# Patient Record
Sex: Female | Born: 1985 | Race: White | Hispanic: Yes | Marital: Single | Smoking: Current every day smoker
Health system: Southern US, Community
[De-identification: ages and names within clinical notes are randomized; demographics above are authoritative.]

## PROBLEM LIST (undated history)

## (undated) DIAGNOSIS — K219 Gastro-esophageal reflux disease without esophagitis: Secondary | ICD-10-CM

## (undated) DIAGNOSIS — F419 Anxiety disorder, unspecified: Secondary | ICD-10-CM

## (undated) DIAGNOSIS — F32A Depression, unspecified: Secondary | ICD-10-CM

## (undated) HISTORY — DX: Gastro-esophageal reflux disease without esophagitis: K21.9

## (undated) HISTORY — DX: Anxiety disorder, unspecified: F41.9

## (undated) HISTORY — DX: Depression, unspecified: F32.A

## (undated) HISTORY — PX: TUBAL LIGATION: SHX77

---
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
## 2007-06-29 ENCOUNTER — Other Ambulatory Visit: Admission: RE | Admit: 2007-06-29 | Discharge: 2007-06-29 | Payer: Self-pay | Admitting: Obstetrics and Gynecology

## 2007-07-29 ENCOUNTER — Inpatient Hospital Stay (HOSPITAL_COMMUNITY): Admission: AD | Admit: 2007-07-29 | Discharge: 2007-07-29 | Payer: Self-pay | Admitting: Obstetrics and Gynecology

## 2007-08-14 ENCOUNTER — Inpatient Hospital Stay (HOSPITAL_COMMUNITY): Admission: AD | Admit: 2007-08-14 | Discharge: 2007-08-16 | Payer: Self-pay | Admitting: Obstetrics and Gynecology

## 2007-12-30 ENCOUNTER — Emergency Department (HOSPITAL_COMMUNITY): Admission: EM | Admit: 2007-12-30 | Discharge: 2007-12-30 | Payer: Self-pay | Admitting: Emergency Medicine

## 2009-03-29 ENCOUNTER — Emergency Department (HOSPITAL_COMMUNITY): Admission: EM | Admit: 2009-03-29 | Discharge: 2009-03-29 | Payer: Self-pay | Admitting: Emergency Medicine

## 2010-09-28 NOTE — H&P (Signed)
Connie [name], Connie [name]                ACCOUNT NO.:  [id number]   MEDICAL RECORD NO.:  [id number]          PATIENT TYPE:  OUT   LOCATION:  ULT                           [facility]   PHYSICIAN:  Charles A. Delcambre, MDDATE OF BIRTH:  1985-05-18   DATE OF ADMISSION:  05/14/2006  DATE OF DISCHARGE:                              HISTORY & PHYSICAL   This patient to be admitted on May 17, 2006 to undergo induction of  labor secondary to intrauterine growth restriction, with ultrasound done  on May 14, 2006 showing estimated fetal weight 3 to 10th percentile,  AFI 9.5 cm.  Repeat AFI at the hospital with a biophysical was 13 cm, to  my recollection.  Estimated fetal weight at the office here was 2658 gm,  5 pounds, 14 ounces.  She had a 10/10 biophysical profile and negative  Dopplers on May 14, 2006 and is to have an NST again tomorrow on  January 4, and if this is normal, she will present for induction on  scheduled date as noted above.  She has noted active fetal movement at  all times.  Pregnancy is complicated by Chlamydia with test positive  December 09, 2005.  Tests have appeared negative, and repeat testing at 36  weeks has been negative.  Other obstetrical labs are blood type B+,  antibody screen negative, VDRL non-reactive, Rubella immune, hepatitis B  surface antigen negative, HIV non-reactive, TSH normal, Pap negative, GC  negative, Chlamydia positive, test of cure negative.  GC/Chlamydia  repeat at 36 weeks negative, RPR at 28 weeks negative, group B strep  negative, 1-hour glucose 71, hemoglobin at 28 weeks 11.7.   PAST MEDICAL HISTORY:  None.   SURGICAL HISTORY:  SVD x1.   MEDICATIONS:  Prenatal vitamins and iron.   ALLERGIES:  PENICILLIN and AMOXICILLIN; reaction not specified.   SOCIAL HISTORY:  Denies tobacco, ethanol or drug use.  She is not  married.  She states she has been in a monogamous relationship with her  fiance, however, STDs as noted above.   FAMILY HISTORY:  Positive for diabetes, hypertension, heart disease,  colon cancer and uterine cancer.  Denied breast, ovary, uterus, cervical  cancer, lymphoma and stroke.   REVIEW OF SYSTEMS:  No chills, fever, rashes or lesions, headaches,  dizziness, scotomata, right upper quadrant pain, shortness of breath,  wheezing, diarrhea, constipation, urgency, frequency, dysuria,  galactorrhea or emotional changes.   PHYSICAL EXAM:  GENERAL:  Alert and oriented x3.  No distress.  VITAL SIGNS:  Blood pressure 100/70, respirations 18, pulse 90, weight  149 pounds, afebrile.  HEENT EXAM:  Grossly within normal limits.  NECK:  Supple without thyromegaly or adenopathy.  LUNGS:  Clear bilaterally.  HEART:  Regular rate and rhythm, 2/6 systolic ejection murmur at left  sternal border.  BREASTS:  No masses, tenderness or discharge, skin or nipple changes  bilaterally.  ABDOMEN:  Gravid.  Fundal height 36 cm at 38 weeks.  PELVIC EXAM:  Normal external female genitalia.  Bartholin's, urethral,  Skene's within normal limits.  Vulva without discharge or lesions.  Cervix  2 cm dilated, 50% effaced, -1 station, vertex and intact.  EXTREMITIES:  Reflexes 1-2+, symmetrical, minimal edema bilaterally.   ASSESSMENT:  Intrauterine pregnancy, planning to be 38 weeks in 4 days,  with intrauterine growth restriction, new onset, history of  oligohydramnios at previous pregnancy at term.   PLAN:  Low-dose Pitocin induction Saturday morning May 17, 2006 at  0830.  Fetal movement precautions have been given through that time, and  NST will be done tomorrow, as well.      Charles A. Sydnee Cabal, MD  Electronically Signed     CAD/MEDQ  D:  05/15/2006  T:  05/15/2006  Job:  [id number]
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
## 2010-09-28 NOTE — H&P (Signed)
NAMEHADIYA, [name]                ACCOUNT NO.:  [id number]   MEDICAL RECORD NO.:  [id number]          PATIENT TYPE:  MAT   LOCATION:  MATC                          [facility]:  [facility]   PHYSICIAN:  James A. Ashley Royalty, M.D.DATE OF BIRTH:  08/21/1985   DATE OF ADMISSION:  05/25/2004  DATE OF DISCHARGE:                                HISTORY & PHYSICAL   This is a 25 year old primigravida, EDC May 26, 2004, 39 weeks, 6 days  gestation.  Prenatal care was complicated by anemia.  The patient was sent  to [facility] this afternoon for an ultrasound in order to assess  fetal growth.  Please see separate ultrasound report. The ultrasound showed  the baby to be AGA.  However, the amniotic fluid index was approximately 7.3  which was slightly greater than the fifth percentile.  The patient was  admitted for the borderline low amniotic fluid index along with advanced  cervical changes at term.   MEDICATIONS:  Vitamins.   PAST MEDICAL HISTORY:  Medical:  Negative.  Surgical:  Negative.   ALLERGIES:  AMOXICILLIN--rash.   FAMILY HISTORY:  Noncontributory.   SOCIAL HISTORY:  The patient denies the use of tobacco or significant  alcohol.   REVIEW OF SYMPTOMS:  Noncontributory.   PHYSICAL EXAMINATION:  GENERAL:  Well-developed, well-nourished, pleasant  white female in no acute distress.  VITAL SIGNS:  Afebrile, vital signs stable.  SKIN:  Warm and dry without lesions.  LYMPH:  There is no supraclavicular, cervical or inguinal adenopathy.  HEENT:  Normocephalic.  CHEST:  Lungs are clear.  CARDIAC:  Regular rate and rhythm.  ABDOMEN:  Gravid with a term fundal height. Fetal heart tones are  auscultated with a Doppler.  MUSCULOSKELETAL:  No CVA tenderness.  PELVIC:  By me (4:20 p.m.), cervix was 2-3 cm dilated, 80%, -1 to zero  station, vertex presentation.   IMPRESSION:  1.  Intrauterine pregnancy at 39 weeks 6 days gestation.  2.  Advanced cervical changes.  3.  Borderline line  low amniotic fluid index on ultrasound performed today.   PLAN:  1.  Admit for overnight observation.  2.  Induction in a.m.  The risks, benefits and complications were discussed      with the patient. She states she understands and accepts. Questions      invited and answered.      JAM/MEDQ  D:  05/25/2004  T:  05/25/2004  Job:  [id number]

## 2010-09-28 NOTE — Op Note (Signed)
[name], Connie                ACCOUNT NO.:  [id number]   MEDICAL RECORD NO.:  [id number]          PATIENT TYPE:  INP   LOCATION:  9175                          [facility]ILITY[facility]  [facility]   PHYSICIAN:  Charles A. Delcambre, MDDATE OF BIRTH:  [birth year]   DATE OF PROCEDURE:  05/17/2006  DATE OF DISCHARGE:                               OPERATIVE REPORT   DELIVERY NOTE:  This patient was admitted to undergo induction of labor secondary to  intrauterine growth restriction at 38 weeks 5 days, estimated fetal  weight 2658 g, normal AFI, negative Dopplers.  She presented with cervix  approximately 3 cm, 75% effaced, -1 station upon arrival and Pitocin had  been given for approximately 30 minutes.  Artificial rupture of  membranes was done, clear amniotic fluid was noted.  There was no  complication.  She progressed on to 4 cm and was given epidural at her  request.  She progressed on to 6 cm, 90% effaced and 0 station and had  some mild variables at that time.  These were progressing to become  moderate.  For that reason amnioinfusion was placed with an intrauterine  pressure catheter.  She precipitously progressed over the next 10-15  minutes, had fetal bradycardia to the 60s for several minutes, returning  to the 80s, and was noted to be complete, +2.  She had an epidural and  had some difficulty pushing at that last point and with the fetal heart  rate remaining at less than 100, she gave informed consent for vacuum  extraction.  The Kiwi vacuum was placed with position right occiput  anterior, complete, complete, +2, over the occiput green zone of the  Kiwi vacuum.  There were no pop-offs and she had good progression with 1  contraction, 1 pull, and delivered without difficulty.  Dr. Alison Murray  attended delivery.  The cord was found to be around the neck very  tightly, had to be delivered through.  Furthermore, it was around the  body and around the leg tightly.  The placenta was spontaneous, 3-  vessel, and intact.  The baby was a vigorous female, Apgars 9 and 9.  A  second degree midline laceration was repaired with 2-0 Vicryl.  Estimated blood loss was 400 mL.  Mother and baby are recovering stably  at this time.      Charles A. Sydnee Cabal, MD  Electronically Signed     CAD/MEDQ  D:  05/17/2006  T:  05/17/2006  Job:  [id number]

## 2011-02-04 LAB — WET PREP, GENITAL
Clue Cells Wet Prep HPF POC: NONE SEEN
Trich, Wet Prep: NONE SEEN

## 2011-02-05 LAB — CBC
Hemoglobin: 12.1
MCHC: 34.3
MCV: 88.1
Platelets: 189
Platelets: 195
RBC: 4.05
RDW: 14.3
WBC: 9.8

## 2011-02-05 LAB — RPR: RPR Ser Ql: NONREACTIVE

## 2011-02-11 ENCOUNTER — Emergency Department (HOSPITAL_COMMUNITY)
Admission: EM | Admit: 2011-02-11 | Discharge: 2011-02-11 | Disposition: A | Discharge status: Home / Self Care | Payer: Self-pay | Attending: Emergency Medicine | Admitting: Emergency Medicine

## 2011-02-11 DIAGNOSIS — R300 Dysuria: Secondary | ICD-10-CM | POA: Insufficient documentation

## 2011-02-11 DIAGNOSIS — N390 Urinary tract infection, site not specified: Secondary | ICD-10-CM | POA: Insufficient documentation

## 2011-02-11 LAB — URINE MICROSCOPIC-ADD ON

## 2011-02-11 LAB — URINALYSIS, ROUTINE W REFLEX MICROSCOPIC
Glucose, UA: NEGATIVE mg/dL
Ketones, ur: NEGATIVE mg/dL
Nitrite: NEGATIVE
Specific Gravity, Urine: 1.028 (ref 1.005–1.030)
pH: 5.5 (ref 5.0–8.0)

## 2015-10-02 ENCOUNTER — Emergency Department (HOSPITAL_COMMUNITY): Payer: 59

## 2015-10-02 ENCOUNTER — Emergency Department (HOSPITAL_COMMUNITY)
Admission: EM | Admit: 2015-10-02 | Discharge: 2015-10-02 | Disposition: A | Discharge status: Home / Self Care | Payer: 59 | Attending: Emergency Medicine | Admitting: Emergency Medicine

## 2015-10-02 ENCOUNTER — Encounter (HOSPITAL_COMMUNITY): Payer: Self-pay | Admitting: *Deleted

## 2015-10-02 DIAGNOSIS — Y939 Activity, unspecified: Secondary | ICD-10-CM | POA: Insufficient documentation

## 2015-10-02 DIAGNOSIS — Y92410 Unspecified street and highway as the place of occurrence of the external cause: Secondary | ICD-10-CM | POA: Insufficient documentation

## 2015-10-02 DIAGNOSIS — S80212A Abrasion, left knee, initial encounter: Secondary | ICD-10-CM | POA: Insufficient documentation

## 2015-10-02 DIAGNOSIS — F17210 Nicotine dependence, cigarettes, uncomplicated: Secondary | ICD-10-CM | POA: Insufficient documentation

## 2015-10-02 DIAGNOSIS — Y999 Unspecified external cause status: Secondary | ICD-10-CM | POA: Insufficient documentation

## 2015-10-02 DIAGNOSIS — S99911A Unspecified injury of right ankle, initial encounter: Secondary | ICD-10-CM | POA: Insufficient documentation

## 2015-10-02 DIAGNOSIS — Z79899 Other long term (current) drug therapy: Secondary | ICD-10-CM | POA: Insufficient documentation

## 2015-10-02 MED ORDER — IBUPROFEN 800 MG PO TABS: 800.0000 mg | ORAL_TABLET | Freq: Three times a day (TID) | ORAL | Status: AC

## 2015-10-02 NOTE — Discharge Instructions (Signed)
Take your medications as prescribed. I recommend eating prior to taking ibuprofen to prevent gastrointestinal side effects. I also recommend resting, elevating and icing your ankle for 15-20 minutes 3-4 times daily times the pain and swelling. Please follow up with a primary care provider from the Resource Guide provided below in 1 week if pain has not improved. Please return to the Emergency Department if symptoms worsen or new onset of fever, redness, swelling, warmth, numbness, tingling, weakness.
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
## 2017-01-23 ENCOUNTER — Emergency Department (HOSPITAL_COMMUNITY)
Admission: EM | Admit: 2017-01-23 | Discharge: 2017-01-24 | Disposition: A | Discharge status: Home / Self Care | Payer: Self-pay | Attending: Emergency Medicine | Admitting: Emergency Medicine

## 2017-01-23 ENCOUNTER — Encounter (HOSPITAL_COMMUNITY): Payer: Self-pay

## 2017-01-23 DIAGNOSIS — R110 Nausea: Secondary | ICD-10-CM

## 2017-01-23 DIAGNOSIS — R112 Nausea with vomiting, unspecified: Secondary | ICD-10-CM | POA: Insufficient documentation

## 2017-01-23 DIAGNOSIS — Z79899 Other long term (current) drug therapy: Secondary | ICD-10-CM | POA: Insufficient documentation

## 2017-01-23 DIAGNOSIS — F17210 Nicotine dependence, cigarettes, uncomplicated: Secondary | ICD-10-CM | POA: Insufficient documentation

## 2017-01-23 NOTE — ED Triage Notes (Signed)
Pt complains of being nauseated for one week, especially everytime she eats

## 2017-01-24 LAB — CBC WITH DIFFERENTIAL/PLATELET
BASOS ABS: 0 10*3/uL (ref 0.0–0.1)
BASOS PCT: 0 %
EOS ABS: 0.1 10*3/uL (ref 0.0–0.7)
EOS PCT: 1 %
HCT: 40.4 % (ref 36.0–46.0)
Hemoglobin: 13.6 g/dL (ref 12.0–15.0)
Lymphocytes Relative: 16 %
Lymphs Abs: 1.5 10*3/uL (ref 0.7–4.0)
MCH: 30.9 pg (ref 26.0–34.0)
MCHC: 33.7 g/dL (ref 30.0–36.0)
MCV: 91.8 fL (ref 78.0–100.0)
MONO ABS: 0.7 10*3/uL (ref 0.1–1.0)
Monocytes Relative: 7 %
Neutro Abs: 7.3 10*3/uL (ref 1.7–7.7)
Neutrophils Relative %: 76 %
PLATELETS: 283 10*3/uL (ref 150–400)
RBC: 4.4 MIL/uL (ref 3.87–5.11)
RDW: 12.6 % (ref 11.5–15.5)
WBC: 9.6 10*3/uL (ref 4.0–10.5)

## 2017-01-24 LAB — URINALYSIS, ROUTINE W REFLEX MICROSCOPIC
BACTERIA UA: NONE SEEN
BILIRUBIN URINE: NEGATIVE
Glucose, UA: NEGATIVE mg/dL
HGB URINE DIPSTICK: NEGATIVE
KETONES UR: 5 mg/dL — AB
Nitrite: NEGATIVE
PROTEIN: NEGATIVE mg/dL
SPECIFIC GRAVITY, URINE: 1.025 (ref 1.005–1.030)
pH: 7 (ref 5.0–8.0)

## 2017-01-24 LAB — COMPREHENSIVE METABOLIC PANEL
ALT: 14 U/L (ref 14–54)
ANION GAP: 6 (ref 5–15)
AST: 16 U/L (ref 15–41)
Albumin: 3.9 g/dL (ref 3.5–5.0)
Alkaline Phosphatase: 56 U/L (ref 38–126)
BILIRUBIN TOTAL: 0.5 mg/dL (ref 0.3–1.2)
BUN: 10 mg/dL (ref 6–20)
CALCIUM: 8.9 mg/dL (ref 8.9–10.3)
CO2: 22 mmol/L (ref 22–32)
Chloride: 109 mmol/L (ref 101–111)
Creatinine, Ser: 0.67 mg/dL (ref 0.44–1.00)
GFR calc non Af Amer: >60 mL/min (ref >60)
Glucose, Bld: 101 mg/dL — ABNORMAL HIGH (ref 65–99)
Potassium: 4.2 mmol/L (ref 3.5–5.1)
SODIUM: 137 mmol/L (ref 135–145)
TOTAL PROTEIN: 7 g/dL (ref 6.5–8.1)

## 2017-01-24 LAB — POC URINE PREG, ED: PREG TEST UR: NEGATIVE

## 2017-01-24 MED ORDER — ONDANSETRON 4 MG PO TBDP: ORAL_TABLET | ORAL | 0 refills | Status: AC

## 2017-01-24 MED ORDER — FAMOTIDINE 20 MG PO TABS: 20.0000 mg | ORAL_TABLET | Freq: Two times a day (BID) | ORAL | 0 refills | Status: AC

## 2017-01-24 NOTE — ED Notes (Signed)
Bed: WTR6 Expected date:  Expected time:  Means of arrival:  Comments: 

## 2017-01-24 NOTE — ED Provider Notes (Signed)
[facility]-EMERGENCY DEPT Provider Note   CSN: [id number] Arrival date & time: 01/23/17  2222     History   Chief Complaint Chief Complaint  Patient presents with  . Nausea    HPI Connie [name] is a 31 y.o. female.  The history is provided by the patient. No language interpreter was used.  Abdominal Pain   This is a new problem. The current episode started more than 2 days ago. The problem has not changed since onset.The pain is associated with eating. The pain is located in the generalized abdominal region. The quality of the pain is colicky. The pain is mild. Associated symptoms include nausea and vomiting. Pertinent negatives include fever.    History reviewed. No pertinent past medical history.  There are no active problems to display for this patient.   History reviewed. No pertinent surgical history.  OB History    No data available       Home Medications    Prior to Admission medications   Medication Sig Start Date End Date Taking? Authorizing Provider  famotidine (PEPCID) 20 MG tablet Take 1 tablet (20 mg total) by mouth 2 (two) times daily. 01/24/17   Felicie Morn, NP  ibuprofen (ADVIL,MOTRIN) 800 MG tablet Take 1 tablet (800 mg total) by mouth 3 (three) times daily. Patient not taking: Reported on 01/24/2017 10/02/15   Barrett Henle, PA-C  ondansetron [facility] ODT) 4 MG disintegrating tablet  ODT q6 hours prn nausea/vomit 01/24/17   Felicie Morn, NP    Family History History reviewed. No pertinent family history.  Social History Social History  Substance Use Topics  . Smoking status: Current Every Day Smoker    Types: Cigarettes  . Smokeless tobacco: Never Used  . Alcohol use Yes     Comment: occa     Allergies   Amoxicillin   Review of Systems Review of Systems  Constitutional: Negative for fever.  Gastrointestinal: Positive for abdominal pain, nausea and vomiting.  All other systems reviewed and are negative.    Physical  Exam Updated Vital Signs BP 114/78 (BP Location: Left Arm)   Pulse 63   Temp 98.4 F (36.9 C) (Oral)   Resp 18   LMP 01/07/2017   SpO2 99%   Physical Exam  Constitutional: She is oriented to person, place, and time. She appears well-developed and well-nourished. No distress.  HENT:  Head: Normocephalic.  Eyes: Conjunctivae are normal.  Neck: Neck supple.  Cardiovascular: Normal rate and regular rhythm.   Pulmonary/Chest: Effort normal and breath sounds normal.  Abdominal: Soft. Bowel sounds are normal. She exhibits no distension. There is generalized tenderness. There is no rigidity, no rebound, no guarding and no CVA tenderness.  Musculoskeletal: She exhibits no edema or tenderness.  Lymphadenopathy:    She has no cervical adenopathy.  Neurological: She is alert and oriented to person, place, and time.  Skin: Skin is warm and dry. No rash noted.  Psychiatric: She has a normal mood and affect.  Nursing note and vitals reviewed.    ED Treatments / Results  Labs (all labs ordered are listed, but only abnormal results are displayed) Labs Reviewed  URINALYSIS, ROUTINE W REFLEX MICROSCOPIC - Abnormal; Notable for the following:       Result Value   Ketones, ur 5 (*)    Leukocytes, UA TRACE (*)    Squamous Epithelial / LPF 0-5 (*)    All other components within normal limits  COMPREHENSIVE METABOLIC PANEL - Abnormal; Notable for the  following:    Glucose, Bld 101 (*)    All other components within normal limits  CBC WITH DIFFERENTIAL/PLATELET  POC URINE PREG, ED    EKG  EKG Interpretation None       Radiology No results found.  Procedures Procedures (including critical care time)  Medications Ordered in ED Medications - No data to display   Initial Impression / Assessment and Plan / ED Course  I have reviewed the triage vital signs and the nursing notes.  Pertinent labs & imaging results that were available during my care of the patient were reviewed by me  and considered in my medical decision making (see chart for details).     Patient is nontoxic, nonseptic appearing, in no apparent distress.  Patient with nausea, occasionally vomiting, after eating. No RUQ tenderness. Normal LFT's. Vitals reviewed.  Patient does not meet the SIRS or Sepsis criteria.  On repeat exam patient does not have a surgical abdomen and there are no peritoneal signs.  Patient discharged home with symptomatic treatment and given strict instructions for follow-up with their primary care physician.  I have also discussed reasons to return immediately to the ER.  Patient expresses understanding and agrees with plan.    Final Clinical Impressions(s) / ED Diagnoses   Final diagnoses:  Nausea    New Prescriptions New Prescriptions   FAMOTIDINE (PEPCID) 20 MG TABLET    Take 1 tablet (20 mg total) by mouth 2 (two) times daily.   ONDANSETRON (ZOFRAN ODT) 4 MG DISINTEGRATING TABLET     ODT q6 hours prn nausea/vomit     Felicie Morn, NP 01/24/17 0151    Nicanor Alcon, April, MD 01/24/17 4010

## 2017-12-28 ENCOUNTER — Emergency Department (HOSPITAL_COMMUNITY)
Admission: EM | Admit: 2017-12-28 | Discharge: 2017-12-28 | Disposition: A | Discharge status: Home / Self Care | Payer: Self-pay | Attending: Emergency Medicine | Admitting: Emergency Medicine

## 2017-12-28 ENCOUNTER — Other Ambulatory Visit: Payer: Self-pay

## 2017-12-28 ENCOUNTER — Encounter (HOSPITAL_COMMUNITY): Payer: Self-pay | Admitting: *Deleted

## 2017-12-28 DIAGNOSIS — F17210 Nicotine dependence, cigarettes, uncomplicated: Secondary | ICD-10-CM | POA: Insufficient documentation

## 2017-12-28 DIAGNOSIS — H9201 Otalgia, right ear: Secondary | ICD-10-CM | POA: Insufficient documentation

## 2017-12-28 MED ORDER — CETIRIZINE-PSEUDOEPHEDRINE ER 5-120 MG PO TB12: 1.0000 | ORAL_TABLET | Freq: Two times a day (BID) | ORAL | 0 refills | Status: AC

## 2017-12-28 NOTE — ED Provider Notes (Signed)
[facility]-EMERGENCY DEPT Provider Note   CSN: [id number] Arrival date & time: 12/28/17  1509     History   Chief Complaint No chief complaint on file.   HPI Connie [name] is a 32 y.o. female who is previously healthy who reports right ear pain that has been intermittent for the past several months.  She reports sometimes will feel like it needs to pop and when she swallows it does pop and improves the pressure feeling.  Yesterday she had a few sharp pains which concerned her.  She denies any significant pain now.  She denies any fevers, sore throat, significant nasal congestion.  She does note she gets nasal congestion sometimes.  She has not tried any medications at home for symptoms.  She does report that she had chronic ear infections as a child and one time had drainage.  HPI  History reviewed. No pertinent past medical history.  There are no active problems to display for this patient.   History reviewed. No pertinent surgical history.   OB History   None      Home Medications    Prior to Admission medications   Medication Sig Start Date End Date Taking? Authorizing Provider  cetirizine-pseudoephedrine (ZYRTEC-D) 5-120 MG tablet Take 1 tablet by mouth 2 (two) times daily. 12/28/17   Liller Yohn, Waylan BogaAlexandra M, PA-C  famotidine (PEPCID) 20 MG tablet Take 1 tablet (20 mg total) by mouth 2 (two) times daily. 01/24/17   Felicie MornSmith, David, NP  ibuprofen (ADVIL,MOTRIN) 800 MG tablet Take 1 tablet (800 mg total) by mouth 3 (three) times daily. Patient not taking: Reported on 01/24/2017 10/02/15   Barrett HenleNadeau, Nicole Elizabeth, PA-C  ondansetron [facility](ZOFRAN ODT) 4 MG disintegrating tablet 4mg  ODT q6 hours prn nausea/vomit 01/24/17   Felicie MornSmith, David, NP    Family History No family history on file.  Social History Social History   Tobacco Use  . Smoking status: Current Every Day Smoker    Packs/day: 0.50    Types: Cigarettes  . Smokeless tobacco: Never Used  Substance Use  Topics  . Alcohol use: Yes    Comment: occa  . Drug use: Yes    Types: Marijuana     Allergies   Amoxicillin   Review of Systems Review of Systems  Constitutional: Negative for fever.  HENT: Positive for ear pain. Negative for congestion, ear discharge and sore throat.   Respiratory: Negative for cough.      Physical Exam Updated Vital Signs BP 100/61 (BP Location: Left Arm)   Pulse 83   Temp 98.4 F (36.9 C) (Oral)   Resp 18   Ht 5\' 2"  (1.575 m)   Wt 59 kg   LMP 12/14/2017 (Exact Date)   SpO2 99%   BMI 23.78 kg/m   Physical Exam  Constitutional: She appears well-developed and well-nourished. No distress.  HENT:  Head: Normocephalic and atraumatic.  Right Ear: No mastoid tenderness. Tympanic membrane is scarred. Tympanic membrane is not injected, not erythematous and not bulging. No middle ear effusion.  Left Ear: Tympanic membrane normal. No mastoid tenderness.  Mouth/Throat: Oropharynx is clear and moist. No oropharyngeal exudate.  Eyes: Pupils are equal, round, and reactive to light. Conjunctivae are normal. Right eye exhibits no discharge. Left eye exhibits no discharge. No scleral icterus.  Neck: Normal range of motion. Neck supple. No thyromegaly present.  Cardiovascular: Normal rate, regular rhythm, normal heart sounds and intact distal pulses. Exam reveals no gallop and no friction rub.  No murmur  heard. Pulmonary/Chest: Effort normal and breath sounds normal. No stridor. No respiratory distress. She has no wheezes. She has no rales.  Musculoskeletal: She exhibits no edema.  Lymphadenopathy:    She has no cervical adenopathy.  Neurological: She is alert. Coordination normal.  Skin: Skin is warm and dry. No rash noted. She is not diaphoretic. No pallor.  Psychiatric: She has a normal mood and affect.  Nursing note and vitals reviewed.    ED Treatments / Results  Labs (all labs ordered are listed, but only abnormal results are displayed) Labs Reviewed  - No data to display  EKG None  Radiology No results found.  Procedures Procedures (including critical care time)  Medications Ordered in ED Medications - No data to display   Initial Impression / Assessment and Plan / ED Course  I have reviewed the triage vital signs and the nursing notes.  Pertinent labs & imaging results that were available during my care of the patient were reviewed by me and considered in my medical decision making (see chart for details).     Patient with intermittent ear pain for months.  No signs of otitis media at this time.    Patient does have a scarred tympanic membrane and she may be having intermittent effusions causing her symptoms. Patient is very well-appearing. Will start Zyrtec-D to help with congestion.  Patient advised to follow-up and establish care with a primary care provider for further evaluation and treatment of her symptoms and referral to ENT as needed at this treatment does not help.  I advised she can also take ibuprofen or Tylenol for her symptoms as prescribed over-the-counter.  Return precautions discussed.  Patient understands and agrees with plan.  Patient vitals stable throughout ED course and discharged in satisfactory condition.  Final Clinical Impressions(s) / ED Diagnoses   Final diagnoses:  Right ear pain    ED Discharge Orders         Ordered    cetirizine-pseudoephedrine (ZYRTEC-D) 5-120 MG tablet  2 times daily     12/28/17 [address]1535           Undrea Shipes, [place] SanteeAlexandra M, [place] JerseyPA-C 12/28/17 1539    Wynetta FinesMessick, Peter C, MD 12/29/17 [phone]

## 2017-12-28 NOTE — Discharge Instructions (Addendum)
I suspect you are occasionally getting fluid in your ears. Take Zyrtec-D 1-2 times daily.  The pseudoephedrine in the medication can hype you up, so see how it makes you feel before you take it before bed.  You can also alternate with ibuprofen or Tylenol as prescribed over-the-counter, as needed for your pain.   Please follow-up and establish care with a primary care provider by calling one of the numbers circled on your paperwork.  Please return the emergency department if you develop any new or worsening symptoms.

## 2017-12-28 NOTE — ED Triage Notes (Signed)
Pt reports right ear pain x 1 month.  Pt reports it feels like it's going to pop.  Pt denies any fevers. Pt a/o x 4 and ambulatory.

## 2017-12-28 NOTE — ED Notes (Signed)
Bed: WTR6 Expected date:  Expected time:  Means of arrival:  Comments: 
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
## 2023-12-24 NOTE — Progress Notes (Signed)
 New Patient Visit  Subjective:     Patient ID: Connie [name], female    DOB: 04/13/1986, 38 y.o.   MRN: [id number]  No chief complaint on file.   HPI  Discussed the use of AI scribe software for clinical note transcription with the patient, who gave verbal consent to proceed.  History of Present Illness Connie [name] is a 38 year old female who presents with concerns of acid reflux and menstrual irregularities.  Gastroesophageal reflux symptoms - Significant acid reflux, especially with spicy or acidic foods - Sensation of food regurgitation - Symptoms became more noticeable after an episode of vomiting in February - Dietary changes to fresher foods and increased fruit intake have provided some relief  Menstrual irregularities and vasomotor symptoms - Increased size of menstrual blood clots - Worsening mood swings associated with menstrual cycle - Hot flashes present - Menarche at age 73 - Concern for early perimenopause due to family history (cousin with recent diagnosis)  Psychological symptoms - Anxiety, particularly in crowded or stressful situations, contributing to frequent nausea - Depressed mood related to menstrual issues - No persistent depression or thoughts of self-harm  Auditory symptoms - Occasional need to pop ear to hear normally - Intermittent brief ringing in the ear - Advised to take Zyrtec  for fluid buildup behind the ear  Easy bruising and suspected anemia - Easy bruising present - Suspects anemia     ROS Per HPI  Outpatient Encounter Medications as of 12/26/2023  Medication Sig   cetirizine -pseudoephedrine  (ZYRTEC -D) 5-120 MG tablet Take 1 tablet by mouth 2 (two) times daily.   famotidine  (PEPCID ) 20 MG tablet Take 1 tablet (20 mg total) by mouth 2 (two) times daily. (Patient taking differently: Take 20 mg by mouth 2 (two) times daily. PRN)   ibuprofen  (ADVIL ,MOTRIN ) 800 MG tablet Take 1 tablet (800 mg total) by mouth 3 (three)  times daily.   ondansetron  (ZOFRAN  ODT) 4 MG disintegrating tablet 4mg  ODT q6 hours prn nausea/vomit (Patient not taking: Reported on 12/26/2023)   No facility-administered encounter medications on file as of 12/26/2023.    Past Medical History:  Diagnosis Date   Anxiety 2023   Depression 06/2023   GERD (gastroesophageal reflux disease) 2023    Past Surgical History:  Procedure Laterality Date   TUBAL LIGATION  02/15/2010    Family History  Problem Relation Age of Onset   Arthritis Mother    Hypertension Mother    Hypertension Sister    Diabetes Brother    Asthma Daughter    Cancer Maternal Grandmother    Cancer Maternal Grandfather    Cancer Paternal Grandmother    Cancer Paternal Grandfather     Social History   Socioeconomic History   Marital status: Single    Spouse name: Not on file   Number of children: Not on file   Years of education: Not on file   Highest education level: 10th grade  Occupational History   Not on file  Tobacco Use   Smoking status: Every Day    Current packs/day: 0.25    Average packs/day: 0.3 packs/day for 15.0 years (3.8 ttl pk-yrs)    Types: Cigarettes   Smokeless tobacco: Never  Vaping Use   Vaping status: Never Used  Substance and Sexual Activity   Alcohol use: Not Currently    Alcohol/week: 6.0 standard drinks of alcohol    Types: 6 Cans of beer per week    Comment: occa   Drug use: Yes  Types: Marijuana   Sexual activity: Yes    Birth control/protection: None  Other Topics Concern   Not on file  Social History Narrative   Not on file   Social Drivers of Health   Financial Resource Strain: Medium Risk (12/22/2023)   Overall Financial Resource Strain (CARDIA)    Difficulty of Paying Living Expenses: Somewhat hard  Food Insecurity: No Food Insecurity (12/22/2023)   Hunger Vital Sign    Worried About Running Out of Food in the Last Year: Never true    Ran Out of Food in the Last Year: Never true  Transportation Needs:  No Transportation Needs (12/22/2023)   PRAPARE - Administrator, Civil Service (Medical): No    Lack of Transportation (Non-Medical): No  Physical Activity: Insufficiently Active (12/22/2023)   Exercise Vital Sign    Days of Exercise per Week: 3 days    Minutes of Exercise per Session: 30 min  Stress: Stress Concern Present (12/22/2023)   [employer] of Occupational Health - Occupational Stress Questionnaire    Feeling of Stress: Rather much  Social Connections: Moderately Isolated (12/22/2023)   Social Connection and Isolation Panel    Frequency of Communication with Friends and Family: More than three times a week    Frequency of Social Gatherings with Friends and Family: Three times a week    Attends Religious Services: Never    Active Member of Clubs or Organizations: No    Attends Engineer, structural: Not on file    Marital Status: Living with partner  Intimate Partner Violence: Not on file       Objective:    BP 118/88 (BP Location: Left Arm, Patient Position: Sitting, Cuff Size: Normal)   Pulse 76   Temp 98.4 F (36.9 C) (Temporal)   Wt 149 lb 12.8 oz (67.9 kg)   SpO2 99%   BMI 27.40 kg/m    Physical Exam Vitals and nursing note reviewed.  Constitutional:      General: She is not in acute distress.    Appearance: Normal appearance. She is normal weight.  HENT:     Head: Normocephalic and atraumatic.     Right Ear: External ear normal.     Left Ear: External ear normal.     Nose: Nose normal.     Mouth/Throat:     Mouth: Mucous membranes are moist.     Pharynx: Oropharynx is clear.  Eyes:     Extraocular Movements: Extraocular movements intact.     Pupils: Pupils are equal, round, and reactive to light.  Cardiovascular:     Rate and Rhythm: Normal rate and regular rhythm.     Pulses: Normal pulses.     Heart sounds: Normal heart sounds.  Pulmonary:     Effort: Pulmonary effort is normal. No respiratory distress.     Breath sounds:  Normal breath sounds. No wheezing, rhonchi or rales.  Musculoskeletal:        General: Normal range of motion.     Cervical back: Normal range of motion.     Right lower leg: No edema.     Left lower leg: No edema.  Lymphadenopathy:     Cervical: No cervical adenopathy.  Neurological:     General: No focal deficit present.     Mental Status: She is alert and oriented to person, place, and time.  Psychiatric:        Mood and Affect: Mood normal.  Thought Content: Thought content normal.     No results found for any visits on 12/26/23.      Assessment & Plan:   Assessment and Plan Assessment & Plan Gastroesophageal reflux disease (GERD) Chronic GERD exacerbated by spicy and acidic foods, with regurgitation and occasional vomiting. Dietary modifications provided relief. - Continue dietary modifications to avoid spicy and acidic foods.  Menstrual irregularities with blood clots and possible perimenopausal symptoms Menstrual irregularities with blood clots, mood swings, and possible perimenopausal symptoms. Family history of early menopause and fibroids. Differential includes fibroids or cysts. - Refer to gynecologist for further evaluation and possible ultrasound.  Anxiety with associated nausea Chronic anxiety with nausea in social or stressful situations.  Easy bruising and possible anemia Easy bruising suggests possible anemia. Family history of thyroid issues, no personal thyroid disease. - Order lab tests to evaluate for anemia and check thyroid function.  Vit D Deficiency - Vit D today  Eustachian tube dysfunction with intermittent tinnitus Intermittent tinnitus and eustachian tube dysfunction with ringing and need to pop ears. - Recommend Zyrtec  for eustachian tube dysfunction. - Advise massaging area behind jaw to facilitate drainage.     Orders Placed This Encounter  Procedures   CBC with Differential/Platelet    Release to patient:   Immediate [1]    Comprehensive metabolic panel with GFR    Release to patient:   Immediate [1]   Lipid panel   TSH   VITAMIN D  25 Hydroxy (Vit-D Deficiency, Fractures)   Vitamin B12   Ambulatory referral to Obstetrics / Gynecology    Referral Priority:   Routine    Referral Type:   Consultation    Referral Reason:   Specialty Services Required    Requested Specialty:   Obstetrics and Gynecology    Number of Visits Requested:   1     No orders of the defined types were placed in this encounter.   Return in about 1 year (around 12/25/2024) for cpe.  Corean LITTIE Ku, FNP

## 2023-12-24 NOTE — Patient Instructions (Addendum)
 Welcome to [facility]!  Thank you for choosing us  for your Primary Care needs.   We offer in person and video appointments for your convenience. You may call our office to schedule appointments, or you may schedule appointments with me through MyChart.   The best way to get in contact with me is via MyChart message. This will get to me faster than a phone call, unless there is an emergency, then please call 911.  The lab is located downstairs in the Sports Medicine building, we also have xray available there.   We are checking labs today, will be in contact with any results that require further attention.  We have sent in a referral to gynecology. Someone will be reaching out to get you scheduled.

## 2023-12-26 ENCOUNTER — Encounter: Payer: Self-pay | Admitting: Family Medicine

## 2023-12-26 ENCOUNTER — Ambulatory Visit: Payer: Self-pay | Admitting: Family Medicine

## 2023-12-26 VITALS — BP 118/88 | HR 76 | Temp 98.4°F | Wt 149.8 lb

## 2023-12-26 DIAGNOSIS — Z124 Encounter for screening for malignant neoplasm of cervix: Secondary | ICD-10-CM | POA: Insufficient documentation

## 2023-12-26 DIAGNOSIS — Z136 Encounter for screening for cardiovascular disorders: Secondary | ICD-10-CM | POA: Insufficient documentation

## 2023-12-26 DIAGNOSIS — F411 Generalized anxiety disorder: Secondary | ICD-10-CM | POA: Insufficient documentation

## 2023-12-26 DIAGNOSIS — N920 Excessive and frequent menstruation with regular cycle: Secondary | ICD-10-CM | POA: Diagnosis not present

## 2023-12-26 DIAGNOSIS — E559 Vitamin D deficiency, unspecified: Secondary | ICD-10-CM | POA: Diagnosis not present

## 2023-12-26 DIAGNOSIS — K219 Gastro-esophageal reflux disease without esophagitis: Secondary | ICD-10-CM | POA: Insufficient documentation

## 2023-12-26 DIAGNOSIS — H6991 Unspecified Eustachian tube disorder, right ear: Secondary | ICD-10-CM | POA: Insufficient documentation

## 2023-12-26 DIAGNOSIS — R233 Spontaneous ecchymoses: Secondary | ICD-10-CM | POA: Insufficient documentation

## 2023-12-26 DIAGNOSIS — Z862 Personal history of diseases of the blood and blood-forming organs and certain disorders involving the immune mechanism: Secondary | ICD-10-CM | POA: Insufficient documentation

## 2023-12-26 LAB — CBC WITH DIFFERENTIAL/PLATELET
Basophils Absolute: 0 K/uL (ref 0.0–0.1)
Basophils Relative: 0.4 % (ref 0.0–3.0)
Eosinophils Absolute: 0.1 K/uL (ref 0.0–0.7)
Eosinophils Relative: 2 % (ref 0.0–5.0)
HCT: 42 % (ref 36.0–46.0)
Hemoglobin: 13.9 g/dL (ref 12.0–15.0)
Lymphocytes Relative: 22.4 % (ref 12.0–46.0)
Lymphs Abs: 1.6 K/uL (ref 0.7–4.0)
MCHC: 33.1 g/dL (ref 30.0–36.0)
MCV: 92.3 fl (ref 78.0–100.0)
Monocytes Absolute: 0.5 K/uL (ref 0.1–1.0)
Monocytes Relative: 6.7 % (ref 3.0–12.0)
Neutro Abs: 4.8 K/uL (ref 1.4–7.7)
Neutrophils Relative %: 68.5 % (ref 43.0–77.0)
Platelets: 269 K/uL (ref 150.0–400.0)
RBC: 4.55 Mil/uL (ref 3.87–5.11)
RDW: 13.4 % (ref 11.5–15.5)
WBC: 7.1 K/uL (ref 4.0–10.5)

## 2023-12-26 LAB — VITAMIN D 25 HYDROXY (VIT D DEFICIENCY, FRACTURES): VITD: 27.76 ng/mL — ABNORMAL LOW (ref 30.00–100.00)

## 2023-12-26 LAB — COMPREHENSIVE METABOLIC PANEL WITH GFR
ALT: 13 U/L (ref 0–35)
AST: 15 U/L (ref 0–37)
Albumin: 4.2 g/dL (ref 3.5–5.2)
Alkaline Phosphatase: 68 U/L (ref 39–117)
BUN: 10 mg/dL (ref 6–23)
CO2: 26 meq/L (ref 19–32)
Calcium: 9.2 mg/dL (ref 8.4–10.5)
Chloride: 107 meq/L (ref 96–112)
Creatinine, Ser: 0.67 mg/dL (ref 0.40–1.20)
GFR: 112.66 mL/min (ref >60.00)
Glucose, Bld: 97 mg/dL (ref 70–99)
Potassium: 4.6 meq/L (ref 3.5–5.1)
Sodium: 139 meq/L (ref 135–145)
Total Bilirubin: 0.4 mg/dL (ref 0.2–1.2)
Total Protein: 7.2 g/dL (ref 6.0–8.3)

## 2023-12-26 LAB — LIPID PANEL
Cholesterol: 241 mg/dL — ABNORMAL HIGH (ref 0–200)
HDL: 42.3 mg/dL (ref >39.00)
LDL Cholesterol: 155 mg/dL — ABNORMAL HIGH (ref 0–99)
NonHDL: 198.97
Total CHOL/HDL Ratio: 6
Triglycerides: 221 mg/dL — ABNORMAL HIGH (ref 0.0–149.0)
VLDL: 44.2 mg/dL — ABNORMAL HIGH (ref 0.0–40.0)

## 2023-12-26 LAB — VITAMIN B12: Vitamin B-12: 184 pg/mL — ABNORMAL LOW (ref 211–911)

## 2023-12-26 LAB — TSH: TSH: 1.63 u[IU]/mL (ref 0.35–5.50)

## 2023-12-29 ENCOUNTER — Ambulatory Visit: Payer: Self-pay | Admitting: Family Medicine

## 2023-12-29 DIAGNOSIS — N926 Irregular menstruation, unspecified: Secondary | ICD-10-CM

## 2023-12-29 DIAGNOSIS — E538 Deficiency of other specified B group vitamins: Secondary | ICD-10-CM | POA: Insufficient documentation

## 2023-12-29 DIAGNOSIS — E559 Vitamin D deficiency, unspecified: Secondary | ICD-10-CM

## 2023-12-29 MED ORDER — VITAMIN D (ERGOCALCIFEROL) 1.25 MG (50000 UNIT) PO CAPS: 50000.0000 [IU] | ORAL_CAPSULE | ORAL | 0 refills | Status: AC

## 2024-03-17 ENCOUNTER — Encounter: Admitting: Obstetrics and Gynecology
